# Patient Record
Sex: Male | Born: 1955 | Race: White | Hispanic: No | Marital: Married | Smoking: Never smoker
Health system: Southern US, Community
[De-identification: ages and names within clinical notes are randomized; demographics above are authoritative.]

## PROBLEM LIST (undated history)

## (undated) DIAGNOSIS — E119 Type 2 diabetes mellitus without complications: Secondary | ICD-10-CM

---

## 2009-12-13 ENCOUNTER — Ambulatory Visit: Payer: Self-pay | Admitting: Family Medicine

## 2009-12-13 DIAGNOSIS — M25519 Pain in unspecified shoulder: Secondary | ICD-10-CM

## 2009-12-13 DIAGNOSIS — M7520 Bicipital tendinitis, unspecified shoulder: Secondary | ICD-10-CM

## 2009-12-13 DIAGNOSIS — R197 Diarrhea, unspecified: Secondary | ICD-10-CM | POA: Insufficient documentation

## 2009-12-13 LAB — CONVERTED CEMR LAB
ALT: 16 units/L (ref 0–53)
AST: 12 units/L (ref 0–37)
Calcium: 8.9 mg/dL (ref 8.4–10.5)
Chloride: 102 meq/L (ref 96–112)
Creatinine, Ser: 0.79 mg/dL (ref 0.40–1.50)
Sodium: 138 meq/L (ref 135–145)
Total Bilirubin: 1 mg/dL (ref 0.3–1.2)
Total Protein: 6.9 g/dL (ref 6.0–8.3)

## 2009-12-15 ENCOUNTER — Encounter: Payer: Self-pay | Admitting: Family Medicine

## 2009-12-17 ENCOUNTER — Encounter: Payer: Self-pay | Admitting: Family Medicine

## 2010-03-17 NOTE — Assessment & Plan Note (Signed)
Summary: Followup Call   Followup call to patient:  discussed lab results, including positive lactoferrin.  Note elevated glucose on CMP.  He reports that he has improved slightly, although he still has diarrhea.  No blood in stool.  No fever. Recommend that he finish Cipro; follow-up with GI if diarrhea persists.  Follow-up with PCP for elevated glucose. Donna Christen MD  December 17, 2009 3:19 PM

## 2010-03-17 NOTE — Assessment & Plan Note (Signed)
Summary: N/D x last night; L shoulder pain x 2 mths rm 5   Vital Signs:  Patient Profile:   55 Years Old Male CC:      Stomach pain - N/D; L shoulder pain x 2 mths Height:     70 inches Weight:      237 pounds O2 Sat:      100 % O2 treatment:    Room Air Temp:     99.0 degrees F oral Pulse rate:   99 / minute Pulse rhythm:   irregular Resp:     16 per minute BP sitting:   130 / 86  (left arm) Cuff size:   regular  Vitals Entered By: Areta Haber CMA (December 13, 2009 9:35 AM)                  Current Allergies: No known allergies History of Present Illness Chief Complaint: Stomach pain - N/D; L shoulder pain x 2 mths History of Present Illness:  Subjective:  Patient presents with two problems:  1)  He just returned from a 5 day trip to [place].  Last night he developed watery diarrhea, mild nausea (without vomiting), chills/sweats, myalgias and a mild headache.  No abdominal pain.  He states that he has had parasite infections in the past as a result of foreign travel.  No respiratory or GU symptoms.  He has a surgical history of gastric bypass. 2)  Two months ago while leading a horse from pasture, the horse bolted and patient developed sudden sharp pain in his left shoulder.  The pain has persisted and he continues to have limited range of motion in his left shoulder.  The pain is often worse at night.  Current Problems: BICEPS TENDINITIS, LEFT (ICD-726.12) SHOULDER PAIN, LEFT (ICD-719.41) DIARRHEA, ACUTE (ICD-787.91) FAMILY HISTORY DIABETES 1ST DEGREE RELATIVE (ICD-V18.0)   Current Meds CYANOCOBALAMIN 1000 MCG/ML SOLN (CYANOCOBALAMIN) once weekly VITAMIN D (ERGOCALCIFEROL) 50000 UNIT CAPS (ERGOCALCIFEROL) 1 tab by mouth twice weekly CIPROFLOXACIN HCL 500 MG TABS (CIPROFLOXACIN HCL) 1 by mouth q12hr  REVIEW OF SYSTEMS Constitutional Symptoms       Complains of fatigue.     Denies fever, chills, night sweats, weight loss, and weight gain.  Eyes       Denies  change in vision, eye pain, eye discharge, glasses, contact lenses, and eye surgery. Ear/Nose/Throat/Mouth       Denies hearing loss/aids, change in hearing, ear pain, ear discharge, dizziness, frequent runny nose, frequent nose bleeds, sinus problems, sore throat, hoarseness, and tooth pain or bleeding.  Respiratory       Denies dry cough, productive cough, wheezing, shortness of breath, asthma, bronchitis, and emphysema/COPD.  Cardiovascular       Denies murmurs, chest pain, and tires easily with exhertion.    Gastrointestinal       Complains of stomach pain, nausea/vomiting, and diarrhea.      Denies constipation, blood in bowel movements, and indigestion.      Comments: x last night Genitourniary       Denies painful urination, kidney stones, and loss of urinary control. Neurological       Complains of headaches.      Denies paralysis, seizures, and fainting/blackouts. Musculoskeletal       Complains of muscle pain.      Denies joint pain, joint stiffness, decreased range of motion, redness, swelling, muscle weakness, and gout.      Comments: L Shoulder x 2 mths Skin  Denies bruising, unusual mles/lumps or sores, and hair/skin or nail changes.  Psych       Denies mood changes, temper/anger issues, anxiety/stress, speech problems, depression, and sleep problems. Other Comments: Pt states he has been in [place] x 1wk, got back last night,started experiencing N/D. Pt states L shoulder pain, no injury, x 2 mths. Pt has not seen his PCP for this.   Past History:  Past Medical History: Vitamin B deficiency Vitamin D deficiency  Past Surgical History: Gastric bypass  Family History: Family History Diabetes 1st degree relative  Social History: Never Smoked Alcohol use-no Drug use-no   Married Regular exercise-yes Smoking Status:  never Drug Use:  no Does Patient Exercise:  yes   Objective:  No acute distress, alert and oriented  Eyes:  Pupils are equal, round, and  reactive to light and accomdation.  Extraocular movement is intact.  Conjunctivae are not inflamed.  Mouth/pharynx:  moist mucous membranes  Neck:  Supple.  No adenopathy is present.  No thyromegaly is present  Lungs:  Clear to auscultation.  Breath sounds are equal.  Heart:  Regular rate and rhythm without murmurs, rubs, or gallops.  Abdomen:  Nontender without masses or hepatosplenomegaly.  Bowel sounds are present.  No CVA or flank tenderness.  Extremities:  No edema.   Left shoulder has decreased /external rotation.  The patient has difficulty actively abducting above the horizontal.  Distinct tenderness over insertion of the biceps tendon.  Patient has difficulty reaching behind his back.  No deformity.  No AC joint tenderness.  Distal neurovascular intact  CBC:  WBC 10.4; Hgb 14.6  LEFT SHOULDER - 2+ VIEW   Comparison: None.   Findings: On the axillary view, the humeral head is located in the glenoid fossa.  No evidence of subluxation or dislocation.  No acute or healing fracture is identified.  The acromioclavicular joint is aligned.  Mild degenerative change of the [facility] joint.   IMPRESSION: No acute bony abnormality.   Mild AC joint degenerative change. Assessment New Problems: BICEPS TENDINITIS, LEFT (ICD-726.12) SHOULDER PAIN, LEFT (ICD-719.41) DIARRHEA, ACUTE (ICD-787.91) FAMILY HISTORY DIABETES 1ST DEGREE RELATIVE (ICD-V18.0)  SUSPECT "TRAVELLER'S DIARRHEA" WITH HISTORY OF RECENT TRAVEL TO [place] PATIENT'S EXAM AND HISTORY (LEFT ARM PULLED FORWARD BY A HORSE) SUGGEST BICEPS TENDON INJURY/TENDONITIS.  HOWEVER MUST ALSO CONSIDER LABRAL TEAR/ROTATOR CUFF INJURY.  Plan New Medications/Changes: CIPROFLOXACIN HCL 500 MG TABS (CIPROFLOXACIN HCL) 1 by mouth q12hr  #10 x 0, 12/13/2009, Donna Christen MD  New Orders: CBC [[id number]] T-Comprehensive Metabolic Panel [80053-22900] T-Culture, Stool [87045/[id number]] T-Lactoferrin Fecal, qual [[id number]] T-Stool for O&P  [87177-70555] New Patient Level III [99203] T-DG Shoulder*L* [73030] Planning Comments:   Stool culture, fecal lactoferrin, O & P exam pending.  Check CMP with history of gastric bypass. Begin Cipro. Clear liquids today (prefer Pedialyte) and slowly advance.  May use Citrucel.  May take Imodium when improving. Follow-up with PCP if not improving 3 to 4 days or if symptoms worsen. For shoulder (after diarrhea improving), begin Ibuprofen 200mg , 4 tabs every 8 hours with food  Begin range of motion exercises ([employer] information and instruction patient handout given).  If shoulder not improving in two weeks, recommend follow-up with [facility] for further evaluation and therapy.   The patient and/or caregiver has been counseled thoroughly with regard to medications prescribed including dosage, schedule, interactions, rationale for use, and possible side effects and they verbalize understanding.  Diagnoses and expected course of recovery discussed and will return if  not improved as expected or if the condition worsens. Patient and/or caregiver verbalized understanding.  Prescriptions: CIPROFLOXACIN HCL 500 MG TABS (CIPROFLOXACIN HCL) 1 by mouth q12hr  #10 x 0   Entered and Authorized by:   Donna Christen MD   Signed by:   Donna Christen MD on 12/13/2009   Method used:   Print then Give to Patient   RxID:   [id number]   Patient Instructions: 1)  Begin clear liquids for a day, then slowly advance diet. 2)  May use Citrucel mixed with fluid for 2 to 4 days. 3)  May take Imodium when improving.  Orders Added: 1)  CBC [85027-10000] 2)  T-Comprehensive Metabolic Panel [80053-22900] 3)  T-Culture, Stool [87045/[id number]] 4)  T-Lactoferrin Fecal, qual [56387-56433] 5)  T-Stool for O&P [87177-70555] 6)  New Patient Level III [99203] 7)  T-DG Shoulder*L* [73030]

## 2010-03-17 NOTE — Letter (Signed)
Summary: Handout Printed  Printed Handout:  - Clear Liquid Diet-Brief 

## 2010-03-26 ENCOUNTER — Other Ambulatory Visit: Payer: Self-pay | Admitting: Family Medicine

## 2010-03-26 ENCOUNTER — Ambulatory Visit (INDEPENDENT_AMBULATORY_CARE_PROVIDER_SITE_OTHER): Payer: BC Managed Care – PPO | Admitting: Family Medicine

## 2010-03-26 ENCOUNTER — Encounter: Payer: Self-pay | Admitting: Family Medicine

## 2010-03-26 ENCOUNTER — Ambulatory Visit
Admission: RE | Admit: 2010-03-26 | Discharge: 2010-03-26 | Disposition: A | Discharge status: Home / Self Care | Payer: BC Managed Care – PPO | Source: Ambulatory Visit | Attending: Family Medicine | Admitting: Family Medicine

## 2010-03-26 DIAGNOSIS — M25569 Pain in unspecified knee: Secondary | ICD-10-CM

## 2010-03-26 DIAGNOSIS — IMO0002 Reserved for concepts with insufficient information to code with codable children: Secondary | ICD-10-CM

## 2010-04-08 NOTE — Assessment & Plan Note (Signed)
Summary: INJ. KNEE/WSE (rm 2)   Vital Signs:  Patient Profile:   55 Years Old Male CC:      left knee pain x tonight Height:     70 inches Weight:      245 pounds O2 Sat:      95 % O2 treatment:    Room Air Temp:     98.0 degrees F oral Pulse rate:   78 / minute Resp:     18 per minute BP sitting:   126 / 82  (left arm) Cuff size:   large  Pt. in pain?   yes    Location:   left knee    Intensity:   5    Type:       sharp  Vitals Entered By: Lajean Saver RN (March 26, 2010 7:48 PM)                   Updated Prior Medication List: CYANOCOBALAMIN 1000 MCG/ML SOLN (CYANOCOBALAMIN) once weekly * VITAMIN B-12 INJ. bi-weekly PRILOSEC 10 MG CPDR (OMEPRAZOLE)   Current Allergies: No known allergies History of Present Illness Chief Complaint: left knee pain x tonight History of Present Illness:  Subjective:  Patient complains of sudden onset pain in left knee while climbing up his cellar stairs this evening carrying grocery bags.  REVIEW OF SYSTEMS Constitutional Symptoms      Denies fever, chills, night sweats, weight loss, weight gain, and fatigue.  Eyes       Denies change in vision, eye pain, eye discharge, glasses, contact lenses, and eye surgery. Ear/Nose/Throat/Mouth       Denies hearing loss/aids, change in hearing, ear pain, ear discharge, dizziness, frequent runny nose, frequent nose bleeds, sinus problems, sore throat, hoarseness, and tooth pain or bleeding.  Respiratory       Denies dry cough, productive cough, wheezing, shortness of breath, asthma, bronchitis, and emphysema/COPD.  Cardiovascular       Denies murmurs, chest pain, and tires easily with exhertion.    Gastrointestinal       Denies stomach pain, nausea/vomiting, diarrhea, constipation, blood in bowel movements, and indigestion. Genitourniary       Denies painful urination, kidney stones, and loss of urinary control. Neurological       Denies paralysis, seizures, and  fainting/blackouts. Musculoskeletal       Complains of muscle pain, joint pain, and joint stiffness.      Denies decreased range of motion, redness, swelling, muscle weakness, and gout.  Skin       Denies bruising, unusual mles/lumps or sores, and hair/skin or nail changes.  Psych       Denies mood changes, temper/anger issues, anxiety/stress, speech problems, depression, and sleep problems. Other Comments: Patient twisted knee tonight. Pain with pressure on the knee   Past History:  Past Medical History: Reviewed history from 12/13/2009 and no changes required. Vitamin B deficiency Vitamin D deficiency  Past Surgical History: Reviewed history from 12/13/2009 and no changes required. Gastric bypass  Family History: Reviewed history from 12/13/2009 and no changes required. Family History Diabetes 1st degree relative  Social History: Reviewed history from 12/13/2009 and no changes required. Never Smoked Alcohol use-no Drug use-no   Married Regular exercise-yes   Objective:  No acute distress   Left knee:  No effusion,  erythema, or warmth.  Knee stable, negative drawer test.  Vague tenderness lateral joint line.  Tenderness in popliteal fossa which seems rather full.  Unable to adequately asses McMurray  test.  Able to fully flex without difficulty.  Distal neurovascular intact  X-ray left knee:  Negative Assessment New Problems: KNEE SPRAIN, LEFT (ICD-844.9) KNEE PAIN, LEFT, ACUTE (ICD-719.46)  KNEE SPRAIN; SUSPECT MENISCUS INJURY  Plan New Medications/Changes: LORTAB 5 5-500 MG TABS (HYDROCODONE-ACETAMINOPHEN) One or two tabs by mouth q4 to 6hr as needed pain  #12 (twelve) x 0, 03/26/2010, Donna Christen MD  New Orders: T-DG Knee Complete 4 Views*L* [11914] Ace Wraps 3-5 in/yard  [N8295] Crutches [E0110] Crutches fitting and training [97760] Est. Patient Level III [62130] Planning Comments:   Apply ice pack for 30 to 45 minutes every 1 to 4 hours.  Continue  until swelling decreases.  Ace wrap applied. No weight bearing; dispensed crutches.  Begin Ibuprofen 200mg , 4 tabs every 8 hours with food  Rx for analgesic Begin knee exercises in about 5 days. If not improving 10 to 14 days, recommend follow-up with sports med clinic.    The patient and/or caregiver has been counseled thoroughly with regard to medications prescribed including dosage, schedule, interactions, rationale for use, and possible side effects and they verbalize understanding.  Diagnoses and expected course of recovery discussed and will return if not improved as expected or if the condition worsens. Patient and/or caregiver verbalized understanding.  Prescriptions: LORTAB 5 5-500 MG TABS (HYDROCODONE-ACETAMINOPHEN) One or two tabs by mouth q4 to 6hr as needed pain  #12 (twelve) x 0   Entered and Authorized by:   Donna Christen MD   Signed by:   Donna Christen MD on 03/26/2010   Method used:   Print then Give to Patient   RxID:   [id number]   Orders Added: 1)  T-DG Knee Complete 4 Views*L* [32440] 2)  Ace Wraps 3-5 in/yard  [A6449] 3)  Crutches [E0110] 4)  Crutches fitting and training [97760] 5)  Est. Patient Level III [10272]    Callback: Patient did not answer. Left message with reason for call and to call back with questions or concerns and to receive x-ray report. Lajean Saver RN  March 28, 2010 3:25 PM

## 2012-02-11 IMAGING — CR DG SHOULDER 2+V*L*
3 series · 3 of 3 positions shown · non-contrast
Comparison: None.

CLINICAL DATA: Persistent left shoulder pain.  Injury 2 months ago.

LEFT SHOULDER - 2+ VIEW

[view not recorded (1 of 3)]
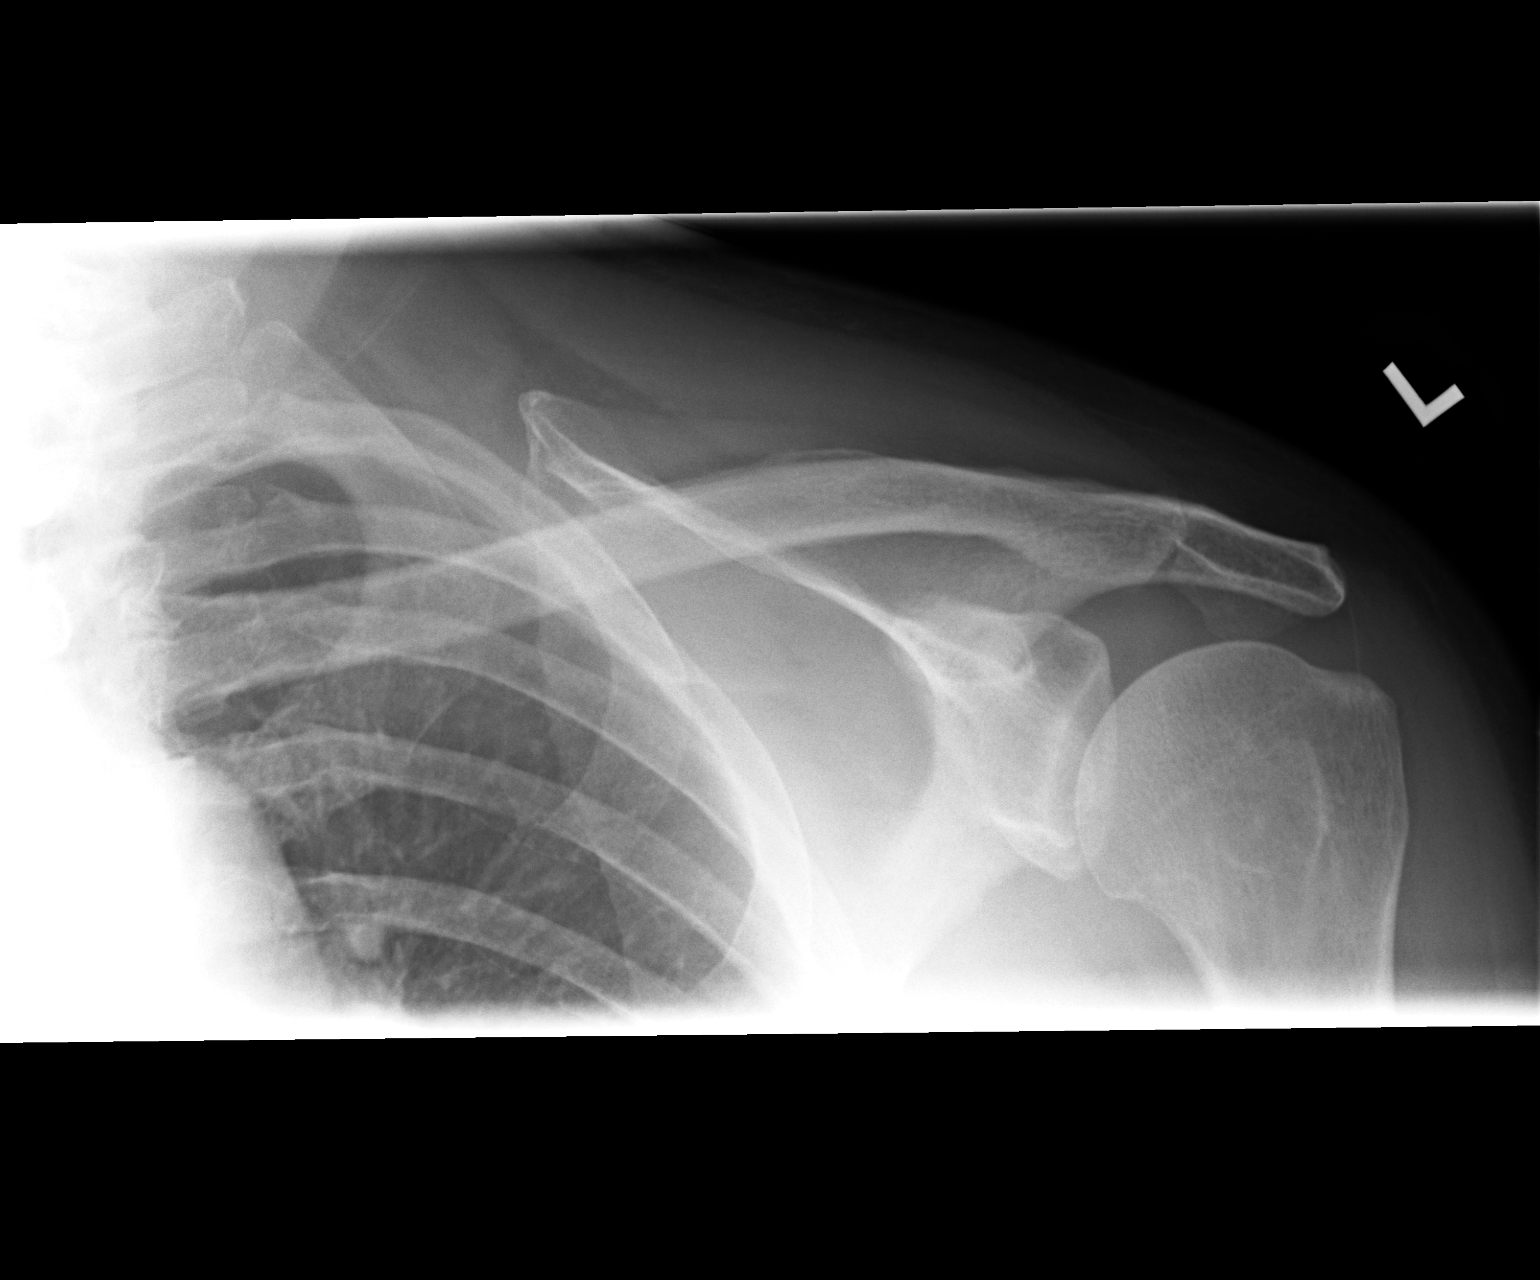

[view not recorded (2 of 3)]
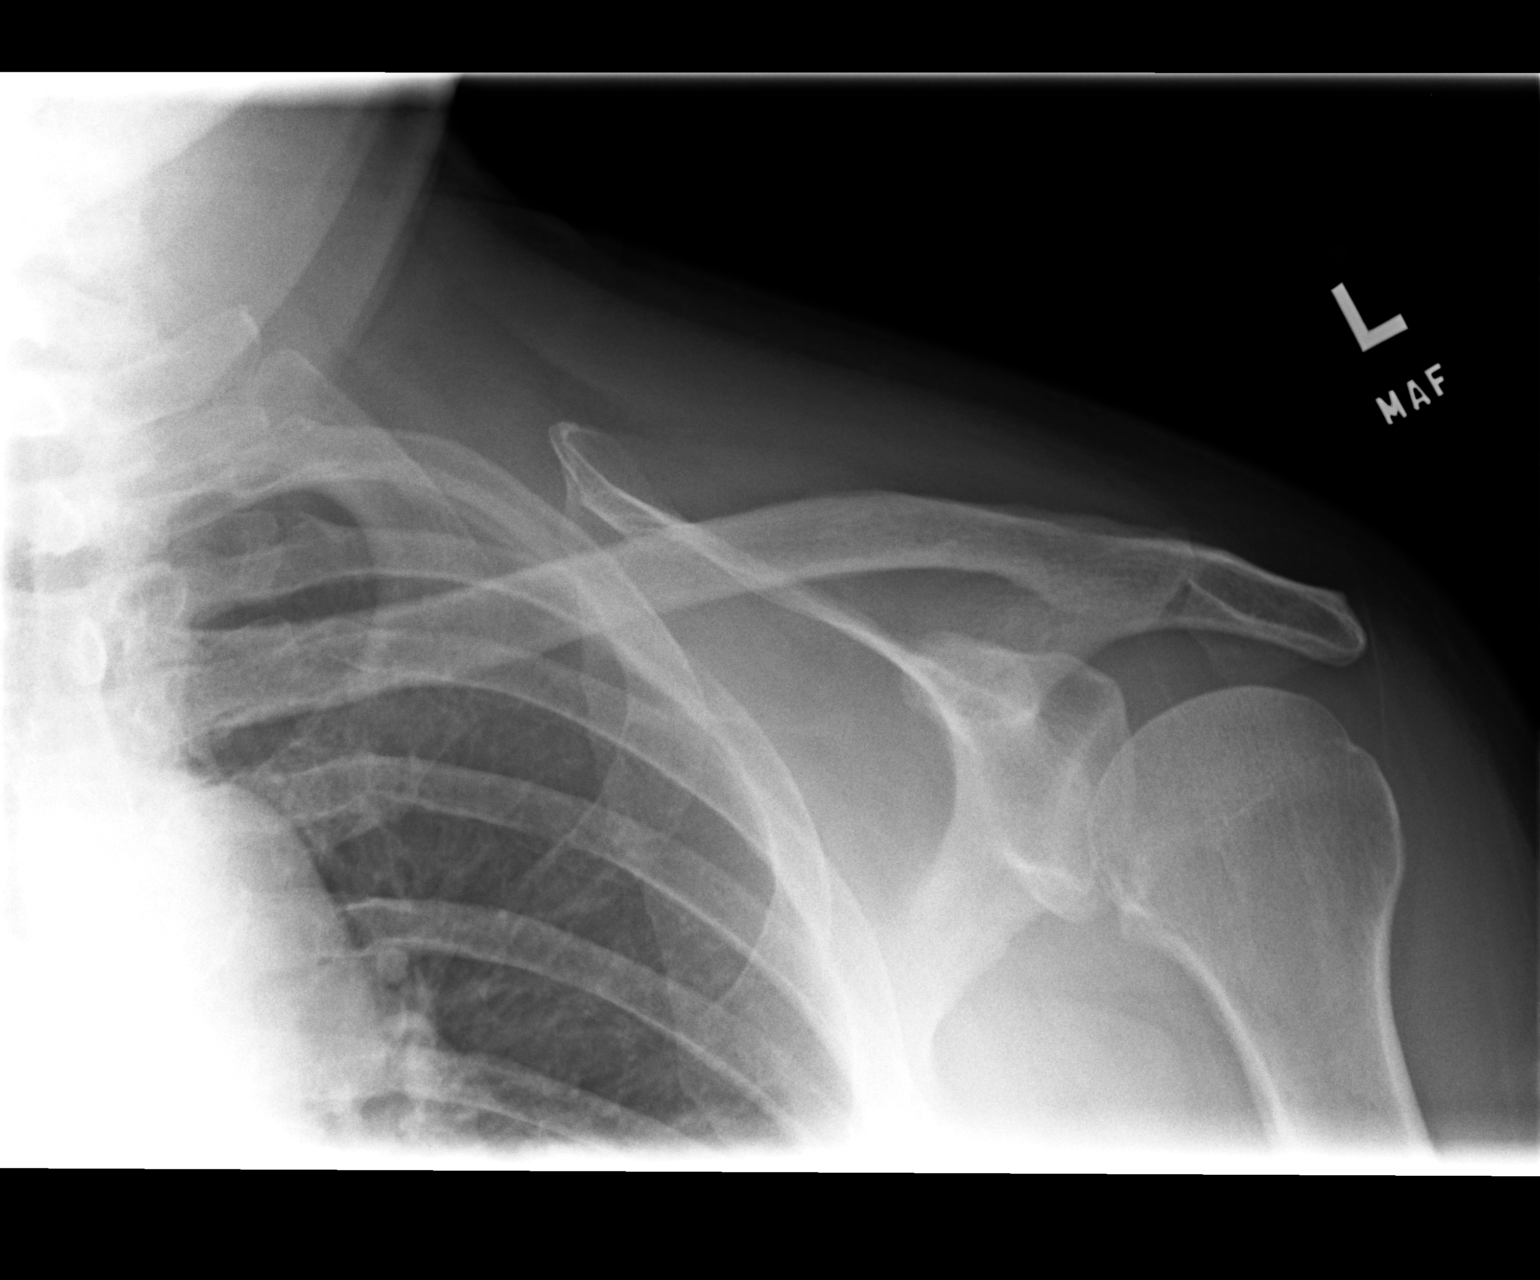

[view not recorded (3 of 3)]
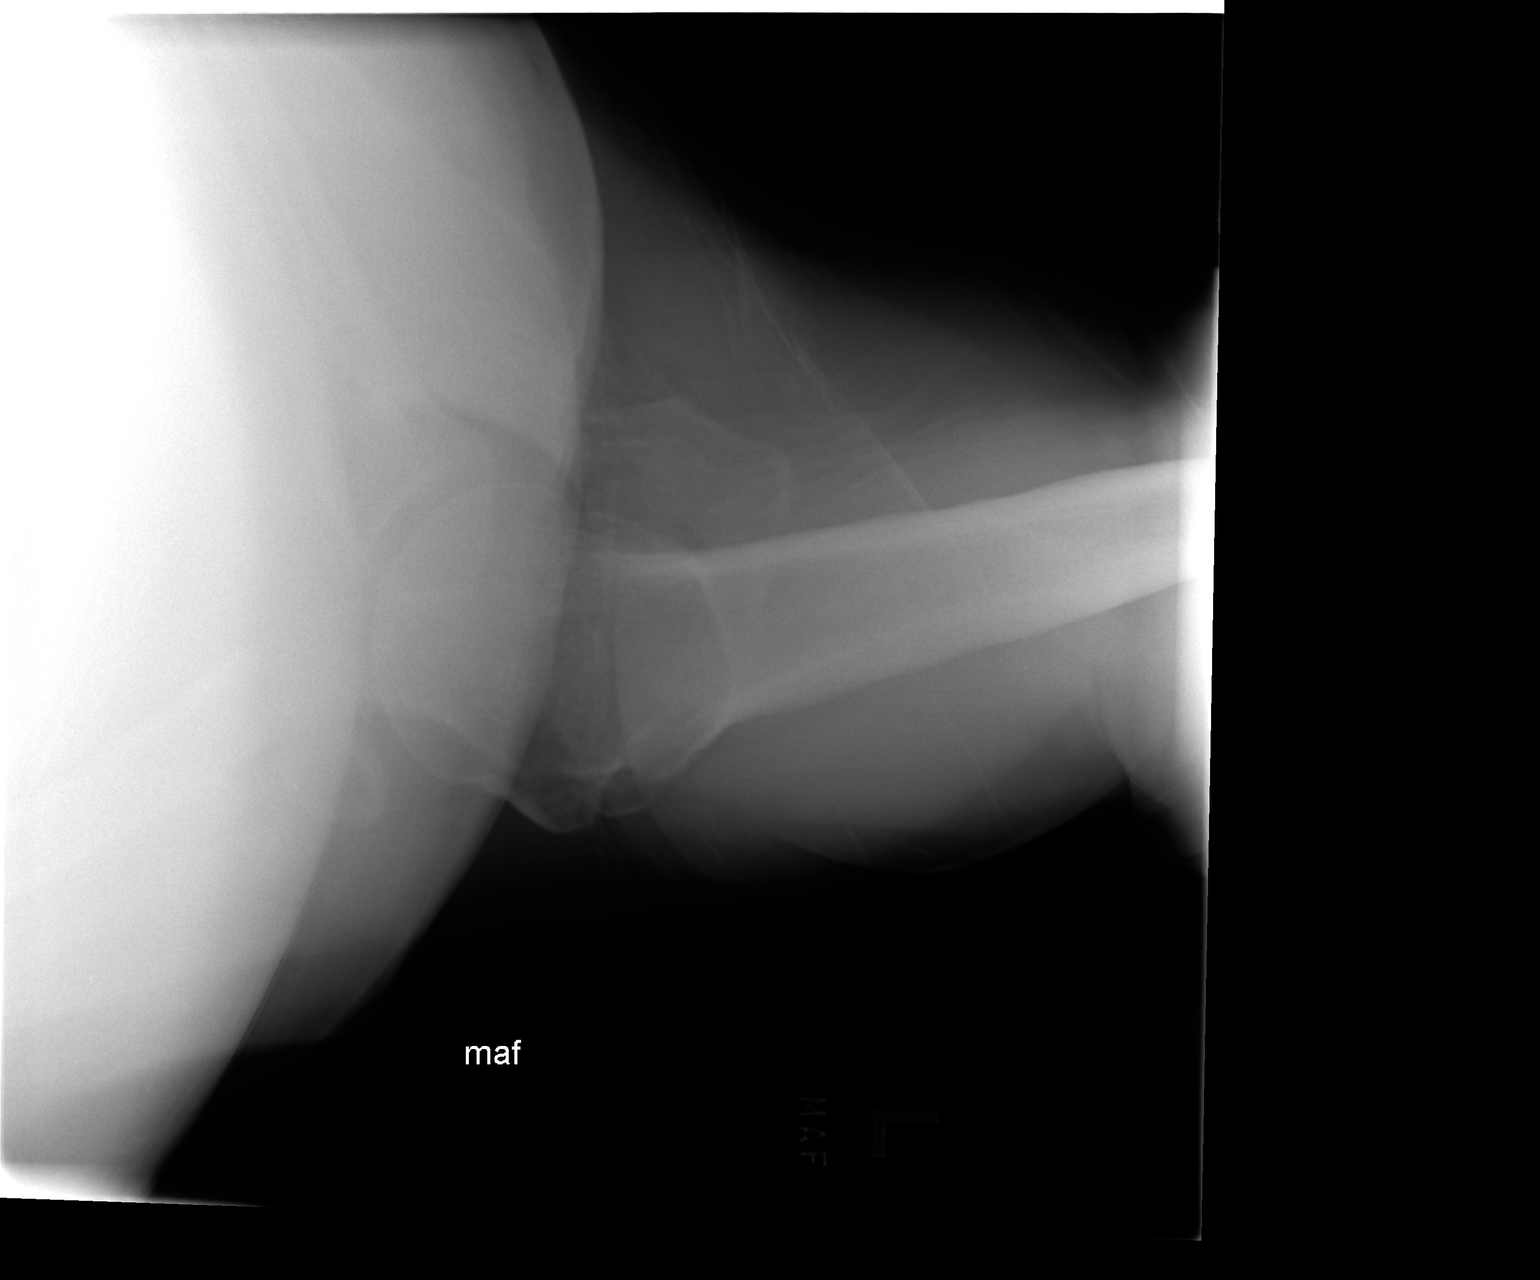

[3 of 3 positions shown; findings below may reference images not displayed]

FINDINGS: On the axillary view, the humeral head is located in the
glenoid fossa.  No evidence of subluxation or dislocation.  No
acute or healing fracture is identified.  The acromioclavicular
joint is aligned.  Mild degenerative change of the AC joint.
IMPRESSION: No acute bony abnormality.

Mild AC joint degenerative change.

## 2016-04-03 ENCOUNTER — Encounter: Payer: Self-pay | Admitting: Emergency Medicine

## 2016-04-03 ENCOUNTER — Emergency Department (INDEPENDENT_AMBULATORY_CARE_PROVIDER_SITE_OTHER)
Admission: EM | Admit: 2016-04-03 | Discharge: 2016-04-03 | Disposition: A | Discharge status: Home / Self Care | Payer: BLUE CROSS/BLUE SHIELD | Source: Home / Self Care | Attending: Family Medicine | Admitting: Family Medicine

## 2016-04-03 DIAGNOSIS — K0889 Other specified disorders of teeth and supporting structures: Secondary | ICD-10-CM

## 2016-04-03 DIAGNOSIS — J0100 Acute maxillary sinusitis, unspecified: Secondary | ICD-10-CM

## 2016-04-03 HISTORY — DX: Type 2 diabetes mellitus without complications: E11.9

## 2016-04-03 MED ORDER — IBUPROFEN 600 MG PO TABS
600.0000 mg | ORAL_TABLET | Freq: Once | ORAL | Status: AC
  Administered 2016-04-03: 600 mg via ORAL

## 2016-04-03 MED ORDER — AMOXICILLIN 875 MG PO TABS: 875.0000 mg | ORAL_TABLET | Freq: Two times a day (BID) | ORAL | 0 refills | Status: DC

## 2016-04-03 NOTE — ED Provider Notes (Signed)
Douglas Woods    CSN: [id number] Arrival date & time: 04/03/16  1741     History   Chief Complaint Chief Complaint  Patient presents with  . Facial Pain    HPI Douglas Woods is a 61 y.o. male.   Patient reports that he developed a URI about 3 weeks ago with sore throat, cough, and sinus congestion.  During the past 5 days he has developed right sided facial pain.  He has also noted discomfort in right mandibular teeth.  No fevers, chills, and sweats.  He was in an airplane last night, and could not equalize pressure in his ears.   The history is provided by the patient.    Past Medical History:  Diagnosis Date  . Diabetes mellitus without complication [facility](HCC)     Patient Active Problem List   Diagnosis Date Noted  . SHOULDER PAIN, LEFT 12/13/2009  . BICEPS TENDINITIS, LEFT 12/13/2009  . DIARRHEA, ACUTE 12/13/2009    History reviewed. No pertinent surgical history.     Home Medications    Prior to Admission medications   Medication Sig Start Date End Date Taking? Authorizing Provider  amoxicillin (AMOXIL) 875 MG tablet Take 1 tablet (875 mg total) by mouth 2 (two) times daily. 04/03/16   Lattie HawStephen A Beese, MD    Family History No family history on file.  Social History Social History  Substance Use Topics  . Smoking status: Never Smoker  . Smokeless tobacco: Never Used  . Alcohol use No     Allergies   Patient has no known allergies.   Review of Systems Review of Systems + sore throat, resolved + cough, resolved No pleuritic pain No wheezing + nasal congestion + post-nasal drainage + sinus pain/pressure No itchy/red eyes ? earache No hemoptysis No SOB No fever/chills No nausea No vomiting No abdominal pain No diarrhea No urinary symptoms No skin rash + fatigue No myalgias No headache Used OTC meds without relief   Physical Exam Triage Vital Signs ED Triage Vitals  Enc Vitals Group     BP 04/03/16 1804 120/82     Pulse  Rate 04/03/16 1804 118     Resp --      Temp 04/03/16 1804 97.6 F (36.4 C)     Temp Source 04/03/16 1804 Oral     SpO2 04/03/16 1804 97 %     Weight 04/03/16 1804 237 lb 8 oz (107.7 kg)     Height 04/03/16 1804 5\' 10"  (1.778 m)     Head Circumference --      Peak Flow --      Pain Score 04/03/16 1806 8     Pain Loc --      Pain Edu? --      Excl. in GC? --    No data found.   Updated Vital Signs BP 120/82 (BP Location: Left Arm)   Pulse 118   Temp 97.6 F (36.4 C) (Oral)   Ht 5\' 10"  (1.778 m)   Wt 237 lb 8 oz (107.7 kg)   SpO2 97%   BMI 34.08 kg/m   Visual Acuity Right Eye Distance:   Left Eye Distance:   Bilateral Distance:    Right Eye Near:   Left Eye Near:    Bilateral Near:     Physical Exam Nursing notes and Vital Signs reviewed. Appearance:  Patient appears stated age, and in no acute distress Eyes:  Pupils are equal, round, and reactive to light and accomodation.  Extraocular movement is intact.  Conjunctivae are not inflamed  Ears:  Canals normal.  Tympanic membranes normal.  Nose:  Congested turbinates.  Maxillary sinus tenderness is present. Mouth:  Teeth and gingiva appear normal; however, tenderness to tap over right mandibular teeth.  Pharynx:  Normal Neck:  Supple.  No adenopathy. Lungs:  Clear to auscultation.  Breath sounds are equal.  Moving air well. Heart:  Regular rate and rhythm without murmurs, rubs, or gallops.  Abdomen:  Nontender without masses or hepatosplenomegaly.  Bowel sounds are present.  No CVA or flank tenderness.  Extremities:  No edema.  Skin:  No rash present.    UC Treatments / Results  Labs (all labs ordered are listed, but only abnormal results are displayed) Labs Reviewed - No data to display  EKG  EKG Interpretation None       Radiology No results found.  Procedures Procedures (including critical Woods time)  Medications Ordered in UC Medications  ibuprofen (ADVIL,MOTRIN) tablet 600 mg (600 mg Oral  Given 04/03/16 1833)     Initial Impression / Assessment and Plan / UC Course  I have reviewed the triage vital signs and the nursing notes.  Pertinent labs & imaging results that were available during my Woods of the patient were reviewed by me and considered in my medical decision making (see chart for details).    Note right mandibular teeth tenderness to tap; ?dental infection. Begin amoxicillin 875mg  BID May use Afrin nasal spray (or generic oxymetazoline) twice daily for about 5 days and then discontinue.  Also recommend using saline nasal spray several times daily and saline nasal irrigation (AYR is a common brand).  Use Flonase nasal spray each morning after using Afrin nasal spray and saline nasal irrigation. Recommend follow-up appt with dentist as soon as possible.  Followup with Family Doctor if not improved in 7 to 10 days.    Final Clinical Impressions(s) / UC Diagnoses   Final diagnoses:  Acute maxillary sinusitis, recurrence not specified  Pain, dental    New Prescriptions New Prescriptions   AMOXICILLIN (AMOXIL) 875 MG TABLET    Take 1 tablet (875 mg total) by mouth 2 (two) times daily.     Lattie Haw, MD 04/07/16 1048

## 2016-04-03 NOTE — Discharge Instructions (Signed)
°  May use Afrin nasal spray (or generic oxymetazoline) twice daily for about 5 days and then discontinue.  Also recommend using saline nasal spray several times daily and saline nasal irrigation (AYR is a common brand).  Use Flonase nasal spray each morning after using Afrin nasal spray and saline nasal irrigation.

## 2016-04-03 NOTE — ED Triage Notes (Signed)
Pt c/o right sided facial pain, goes down to chin, under nose, possible sinus infection.

## 2022-01-25 ENCOUNTER — Ambulatory Visit (INDEPENDENT_AMBULATORY_CARE_PROVIDER_SITE_OTHER): Payer: Medicare Other

## 2022-01-25 ENCOUNTER — Encounter: Payer: Self-pay | Admitting: Emergency Medicine

## 2022-01-25 ENCOUNTER — Ambulatory Visit
Admission: EM | Admit: 2022-01-25 | Discharge: 2022-01-25 | Disposition: A | Discharge status: Home / Self Care | Payer: Medicare Other

## 2022-01-25 ENCOUNTER — Other Ambulatory Visit: Payer: Self-pay

## 2022-01-25 DIAGNOSIS — R1030 Lower abdominal pain, unspecified: Secondary | ICD-10-CM

## 2022-01-25 DIAGNOSIS — R1031 Right lower quadrant pain: Secondary | ICD-10-CM

## 2022-01-25 LAB — I-STAT CREATININE (MANUAL ENTRY): Creatinine, Ser: 1 (ref 0.50–1.10)

## 2022-01-25 MED ORDER — IOHEXOL 300 MG/ML  SOLN
100.0000 mL | Freq: Once | INTRAMUSCULAR | Status: AC | PRN
  Administered 2022-01-25: 100 mL via INTRAVENOUS

## 2022-01-25 NOTE — ED Provider Notes (Signed)
[facility]    CSN: [id number] Arrival date & time: 01/25/22  1137      History   Chief Complaint Chief Complaint  Patient presents with   Abdominal Pain    HPI Douglas Woods is a 66 y.o. male.   HPI 66 year old male presents with lower abdominal pain for 3 to 4 days.  Reports was initially constipated but had no a large bowel movement this morning.  Reports lower abdominal pain remains and has been taking stool softener.  Past Medical History:  Diagnosis Date   Diabetes mellitus without complication [facility])     Patient Active Problem List   Diagnosis Date Noted   SHOULDER PAIN, LEFT 12/13/2009   BICEPS TENDINITIS, LEFT 12/13/2009   DIARRHEA, ACUTE 12/13/2009    History reviewed. No pertinent surgical history.     Home Medications    Prior to Admission medications   Medication Sig Start Date End Date Taking? Authorizing Provider  amLODipine (NORVASC) 5 MG tablet Take 5 mg by mouth daily.   Yes [provider]  empagliflozin (JARDIANCE) 10 MG TABS tablet Take by mouth daily.   Yes [provider]  gabapentin (NEURONTIN) 100 MG capsule Take 300 mg by mouth 3 (three) times daily.   Yes [provider]  losartan (COZAAR) 50 MG tablet Take 50 mg by mouth daily.   Yes [provider]  metformin (FORTAMET) 500 MG (OSM) 24 hr tablet Take 500 mg by mouth daily with breakfast.   Yes [provider]    Family History History reviewed. No pertinent family history.  Social History Social History   Tobacco Use   Smoking status: Never   Smokeless tobacco: Never  Vaping Use   Vaping Use: Never used  Substance Use Topics   Alcohol use: No     Allergies   Patient has no known allergies.   Review of Systems Review of Systems  Gastrointestinal:  Positive for abdominal pain and constipation.  All other systems reviewed and are negative.    Physical Exam Triage Vital Signs ED Triage Vitals  Enc Vitals  Group     BP      Pulse      Resp      Temp      Temp src      SpO2      Weight      Height      Head Circumference      Peak Flow      Pain Score      Pain Loc      Pain Edu?      Excl. in GC?    No data found.  Updated Vital Signs BP 114/75 (BP Location: Left Arm)   Pulse 68   Temp 98.8 F (37.1 C) (Oral)   Resp 16   Ht 5\' 10"  (1.778 m)   Wt 226 lb (102.5 kg)   SpO2 96%   BMI 32.43 kg/m       Physical Exam Vitals and nursing note reviewed.  Constitutional:      General: He is not in acute distress.    Appearance: He is well-developed. He is obese. He is not ill-appearing.  HENT:     Head: Normocephalic and atraumatic.     Mouth/Throat:     Mouth: Mucous membranes are moist.     Pharynx: Oropharynx is clear.  Eyes:     Extraocular Movements: Extraocular movements intact.     Pupils: Pupils are equal,  round, and reactive to light.  Cardiovascular:     Rate and Rhythm: Normal rate and regular rhythm.     Heart sounds: Normal heart sounds. No murmur heard. Pulmonary:     Effort: Pulmonary effort is normal.     Breath sounds: No wheezing, rhonchi or rales.  Abdominal:     General: Abdomen is flat. Bowel sounds are absent. There is no distension or abdominal bruit.     Palpations: Abdomen is soft. There is no shifting dullness, fluid wave, hepatomegaly, splenomegaly, mass or pulsatile mass.     Tenderness: There is abdominal tenderness in the right lower quadrant, suprapubic area and left lower quadrant. There is no right CVA tenderness, left CVA tenderness, guarding or rebound. Negative signs include Murphy's sign, McBurney's sign and psoas sign.  Skin:    General: Skin is warm and dry.  Neurological:     General: No focal deficit present.     Mental Status: He is alert and oriented to person, place, and time.      UC Treatments / Results  Labs (all labs ordered are listed, but only abnormal results are displayed) Labs Reviewed - No data to  display  EKG   Radiology CT ABDOMEN PELVIS W CONTRAST  Result Date: 01/25/2022 CLINICAL DATA:  RLQ abdominal pain EXAM: CT ABDOMEN AND PELVIS WITH CONTRAST TECHNIQUE: Multidetector CT imaging of the abdomen and pelvis was performed using the standard protocol following bolus administration of intravenous contrast. RADIATION DOSE REDUCTION: This exam was performed according to the departmental dose-optimization program which includes automated exposure control, adjustment of the mA and/or kV according to patient size and/or use of iterative reconstruction technique. CONTRAST:  OMNIPAQUE IOHEXOL 300 MG/ML  SOLN COMPARISON:  None Available. FINDINGS: Lower chest: Left lower lobe centrally calcified 2 x 1.7 cm pulmonary nodule. At least 3 vessel coronary calcification. Hepatobiliary: No focal liver abnormality. Status post cholecystectomy. No biliary dilatation. Pancreas: No focal lesion. Normal pancreatic contour. No surrounding inflammatory changes. No main pancreatic ductal dilatation. Spleen: Multiple calcifications likely sequela of prior granulomatous disease. The spleen is enlarged measuring up 14 cm. No focal abnormality. Adrenals/Urinary Tract: No adrenal nodule bilaterally. Bilateral kidneys enhance symmetrically. No hydronephrosis. No hydroureter. The urinary bladder is unremarkable. On delayed imaging, there is no urothelial wall thickening and there are no filling defects in the opacified portions of the bilateral collecting systems or ureters. Stomach/Bowel: Roux-en-Y gastric bypass. Stomach is within normal limits. No evidence of bowel wall thickening or dilatation. Appendix appears normal. Vascular/Lymphatic: No abdominal aorta or iliac aneurysm. Mild atherosclerotic plaque of the aorta and its branches. No abdominal, pelvic, or inguinal lymphadenopathy. Reproductive: The prostate is enlarged measuring up to 6.3 cm. Other: No intraperitoneal free fluid. No intraperitoneal free gas. No  organized fluid collection. Musculoskeletal: No abdominal wall hernia or abnormality. No suspicious lytic or blastic osseous lesions. No acute displaced fracture. L5-S1 intervertebral vacuum phenomenon. Total right hip arthroplasty. IMPRESSION: 1. Left lower lobe centrally calcified 2 x 1.7 cm pulmonary nodule. Finding likely represents a granuloma. Correlation with prior radiographs or cross-sectional imaging would be of value. If not available, consider 3 months repeat chest CT. This recommendation follows the consensus statement: Guidelines for Management of Incidental Pulmonary Nodules Detected on CT Images: From the Fleischner Society 2017; Radiology 2017; 284:228-243. 2. Nonspecific mild splenomegaly. Splenic calcifications likely sequelae of prior granulomatous disease. 3. Prostatomegaly. 4.  Aortic Atherosclerosis (ICD10-I70.0). Electronically Signed   By: Tish Frederickson M.D.   On: 01/25/2022 16:02  Procedures Procedures (including critical care time)  Medications Ordered in UC Medications - No data to display  Initial Impression / Assessment and Plan / UC Course  I have reviewed the triage vital signs and the nursing notes.  Pertinent labs & imaging results that were available during my care of the patient were reviewed by me and considered in my medical decision making (see chart for details).     MDM: 1.  Lower abdominal pain-Advised patient of CT abdomen/pelvis with contrast results with hard copy provided to patient.  Advised patient if lower abdominal pain worsens and/or unresolved please follow-up with PCP and/or GI for further evaluation.  Discharged home, hemodynamically stable. Final Clinical Impressions(s) / UC Diagnoses   Final diagnoses:  Lower abdominal pain     Discharge Instructions      Advised patient of CT abdomen/pelvis with contrast results with hard copy provided to patient.  Advised patient if lower abdominal pain worsens and/or unresolved please follow-up  with PCP and/or GI for further evaluation.     ED Prescriptions   None    PDMP not reviewed this encounter.   Trevor Iha, FNP 01/25/22 [phone]

## 2022-01-25 NOTE — Discharge Instructions (Addendum)
Advised patient of CT abdomen/pelvis with contrast results with hard copy provided to patient.  Advised patient if lower abdominal pain worsens and/or unresolved please follow-up with PCP and/or GI for further evaluation.

## 2022-01-25 NOTE — ED Triage Notes (Signed)
Lower abdominal pain, was constipated until with morning, when he did have a bm, but pain remains, he has been taking a stool softener.
